# Patient Record
Sex: Male | Born: 2005 | Race: White | Hispanic: No | Marital: Single | Smoking: Never smoker
Health system: Southern US, Community
[De-identification: ages and names within clinical notes are randomized; demographics above are authoritative.]

## PROBLEM LIST (undated history)

## (undated) DIAGNOSIS — F909 Attention-deficit hyperactivity disorder, unspecified type: Secondary | ICD-10-CM

## (undated) HISTORY — PX: HERNIA REPAIR: SHX51

---
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
## 2023-08-20 ENCOUNTER — Other Ambulatory Visit: Payer: Self-pay

## 2023-08-20 ENCOUNTER — Encounter (HOSPITAL_COMMUNITY): Payer: Self-pay | Admitting: Psychiatry

## 2023-08-20 ENCOUNTER — Ambulatory Visit (HOSPITAL_BASED_OUTPATIENT_CLINIC_OR_DEPARTMENT_OTHER): Payer: Self-pay | Admitting: Psychiatry

## 2023-08-20 VITALS — BP 120/75 | HR 73 | Ht 74.0 in | Wt 146.0 lb

## 2023-08-20 DIAGNOSIS — F1021 Alcohol dependence, in remission: Secondary | ICD-10-CM | POA: Diagnosis not present

## 2023-08-20 DIAGNOSIS — F1511 Other stimulant abuse, in remission: Secondary | ICD-10-CM

## 2023-08-20 DIAGNOSIS — F1211 Cannabis abuse, in remission: Secondary | ICD-10-CM | POA: Diagnosis not present

## 2023-08-20 DIAGNOSIS — F411 Generalized anxiety disorder: Secondary | ICD-10-CM | POA: Diagnosis not present
